# Patient Record
Sex: Female | Born: 1950 | Race: White | Hispanic: No | Marital: Married
Health system: Southern US, Community
[De-identification: ages and names within clinical notes are randomized; demographics above are authoritative.]

---

## 2000-12-10 ENCOUNTER — Ambulatory Visit (HOSPITAL_COMMUNITY): Admission: AD | Admit: 2000-12-10 | Discharge: 2000-12-10 | Payer: Self-pay | Admitting: *Deleted

## 2003-04-19 ENCOUNTER — Ambulatory Visit (HOSPITAL_COMMUNITY): Admission: RE | Admit: 2003-04-19 | Discharge: 2003-04-19 | Payer: Self-pay | Admitting: Thoracic Surgery

## 2003-08-04 ENCOUNTER — Ambulatory Visit (HOSPITAL_COMMUNITY): Admission: RE | Admit: 2003-08-04 | Discharge: 2003-08-04 | Payer: Self-pay | Admitting: Thoracic Surgery

## 2005-01-30 IMAGING — CR DG CHEST 2V
2 series · 2 of 2 positions shown · non-contrast
Comparison: none

CLINICAL DATA: Renal failure.  Preoperative evaluation.  Diabetes.  Hypertension.  
 TWO VIEW CHEST RADIOGRAPH, 04/19/03
 No prior studies.

[view not recorded (1 of 2)]
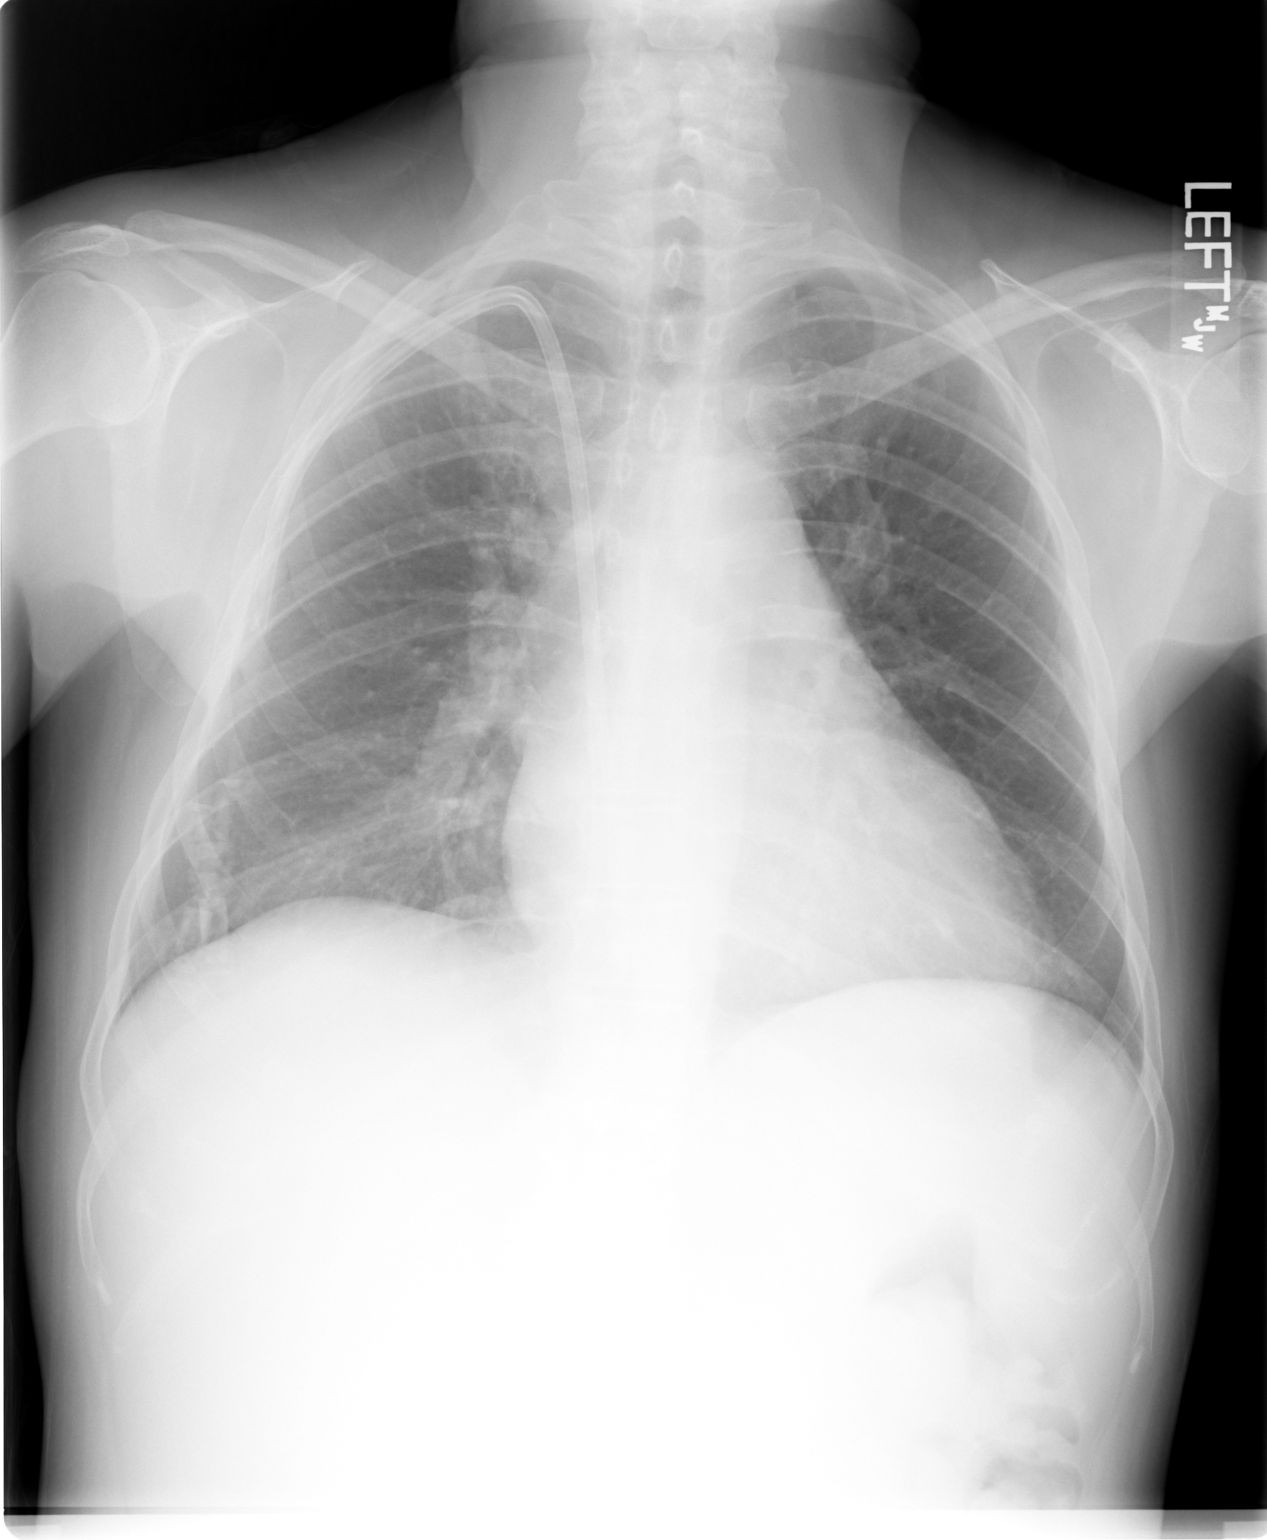

[view not recorded (2 of 2)]
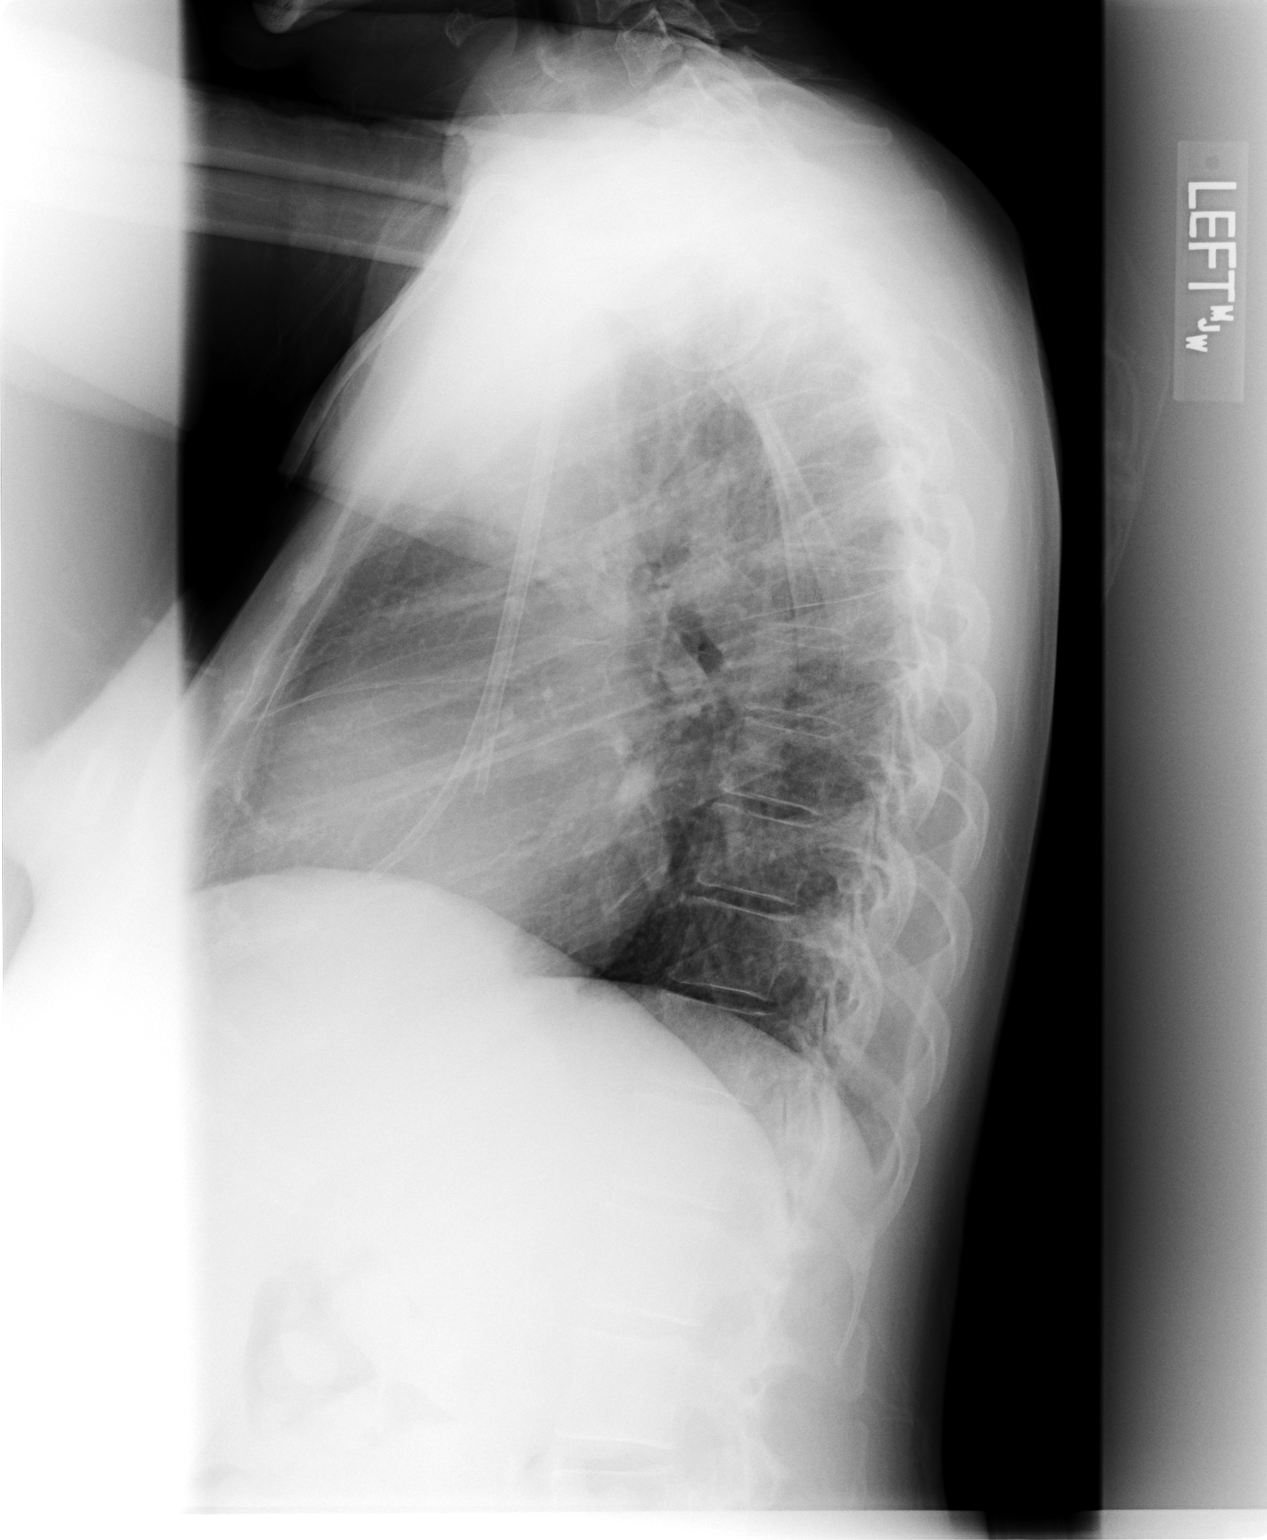

[2 of 2 positions shown; findings below may reference images not displayed]

FINDINGS: Right internal jugular central venous catheter is present with its distal tip in the right atrium.  Mild cardiomegaly is present.  The lungs appear clear.  
 IMPRESSION
 Mild cardiomegaly.

## 2005-08-08 ENCOUNTER — Ambulatory Visit (HOSPITAL_COMMUNITY): Admission: RE | Admit: 2005-08-08 | Discharge: 2005-08-08 | Payer: Self-pay | Admitting: Thoracic Surgery

## 2007-05-22 IMAGING — CR DG CHEST 2V
2 series · 2 of 2 positions shown · non-contrast
Comparison: 04/21/03.

CLINICAL DATA: Diabetes.  Hypertension.  Retained Diatek catheter.
 2-VIEW CHEST RADIOGRAPH - 08/08/05:

[view not recorded (1 of 2)]
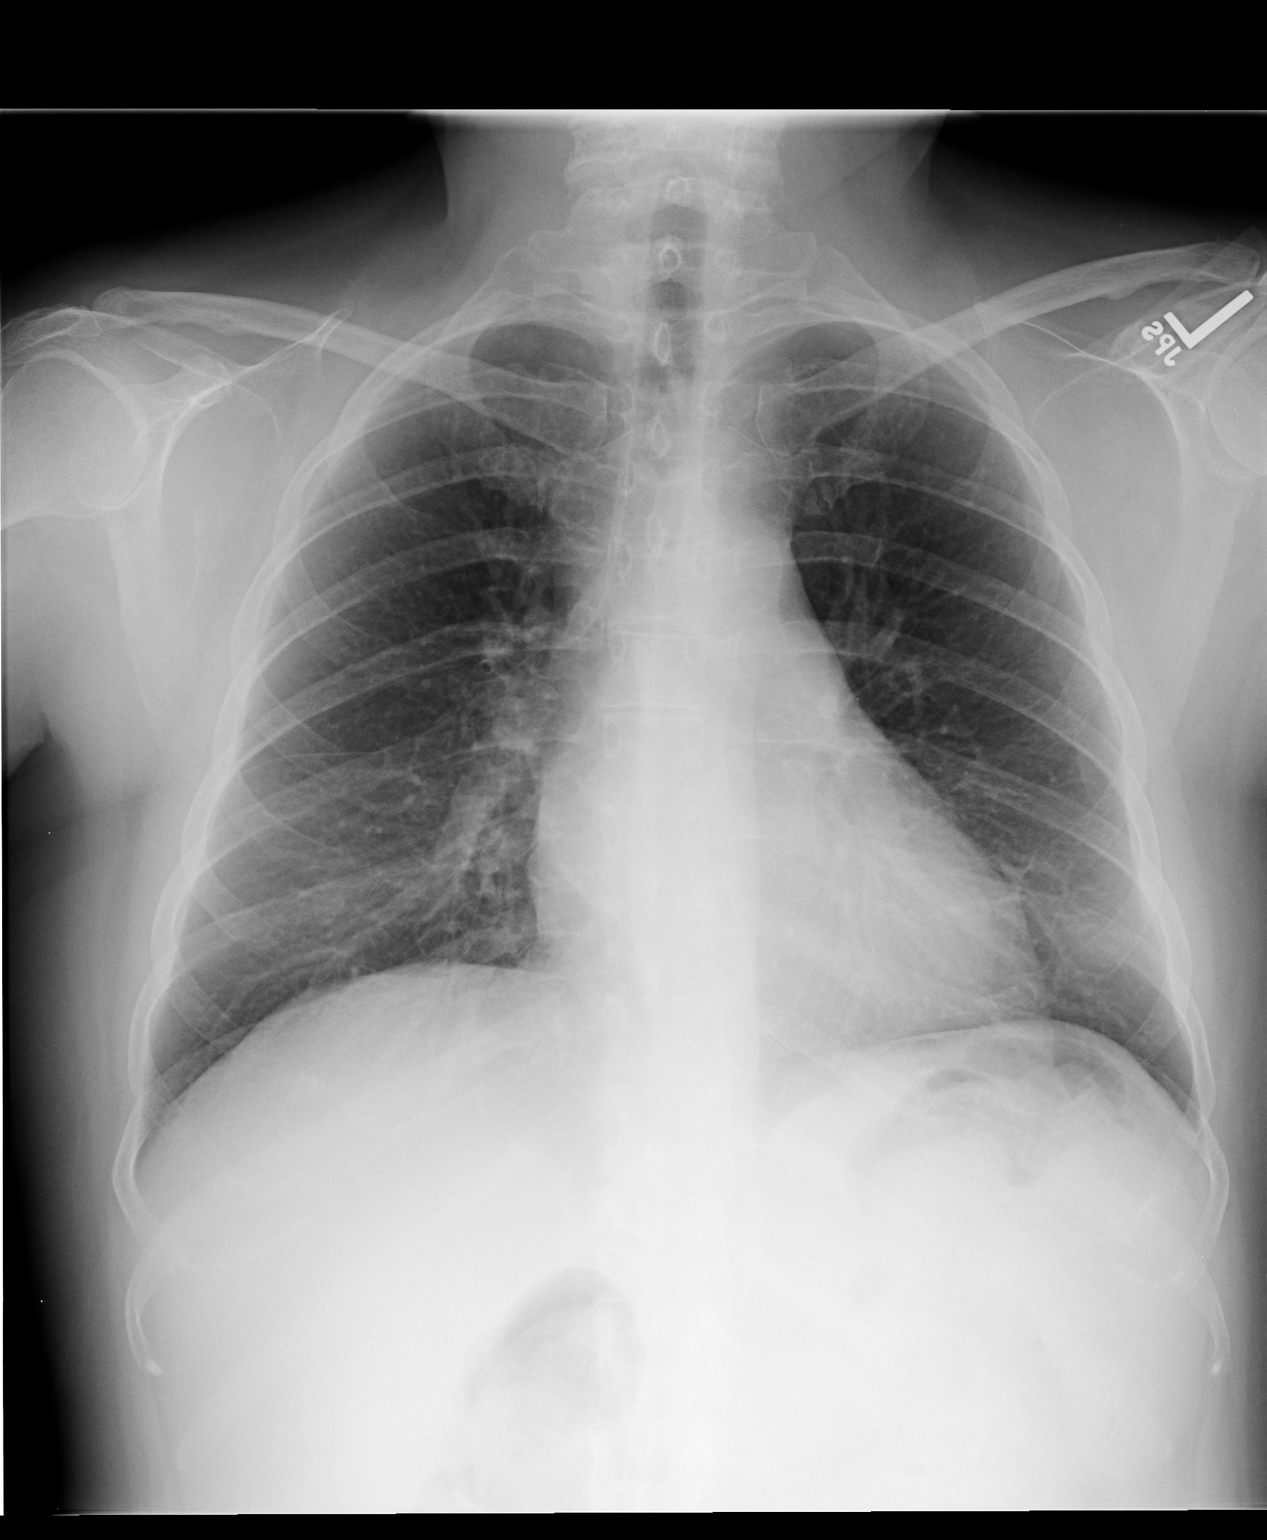

[view not recorded (2 of 2)]
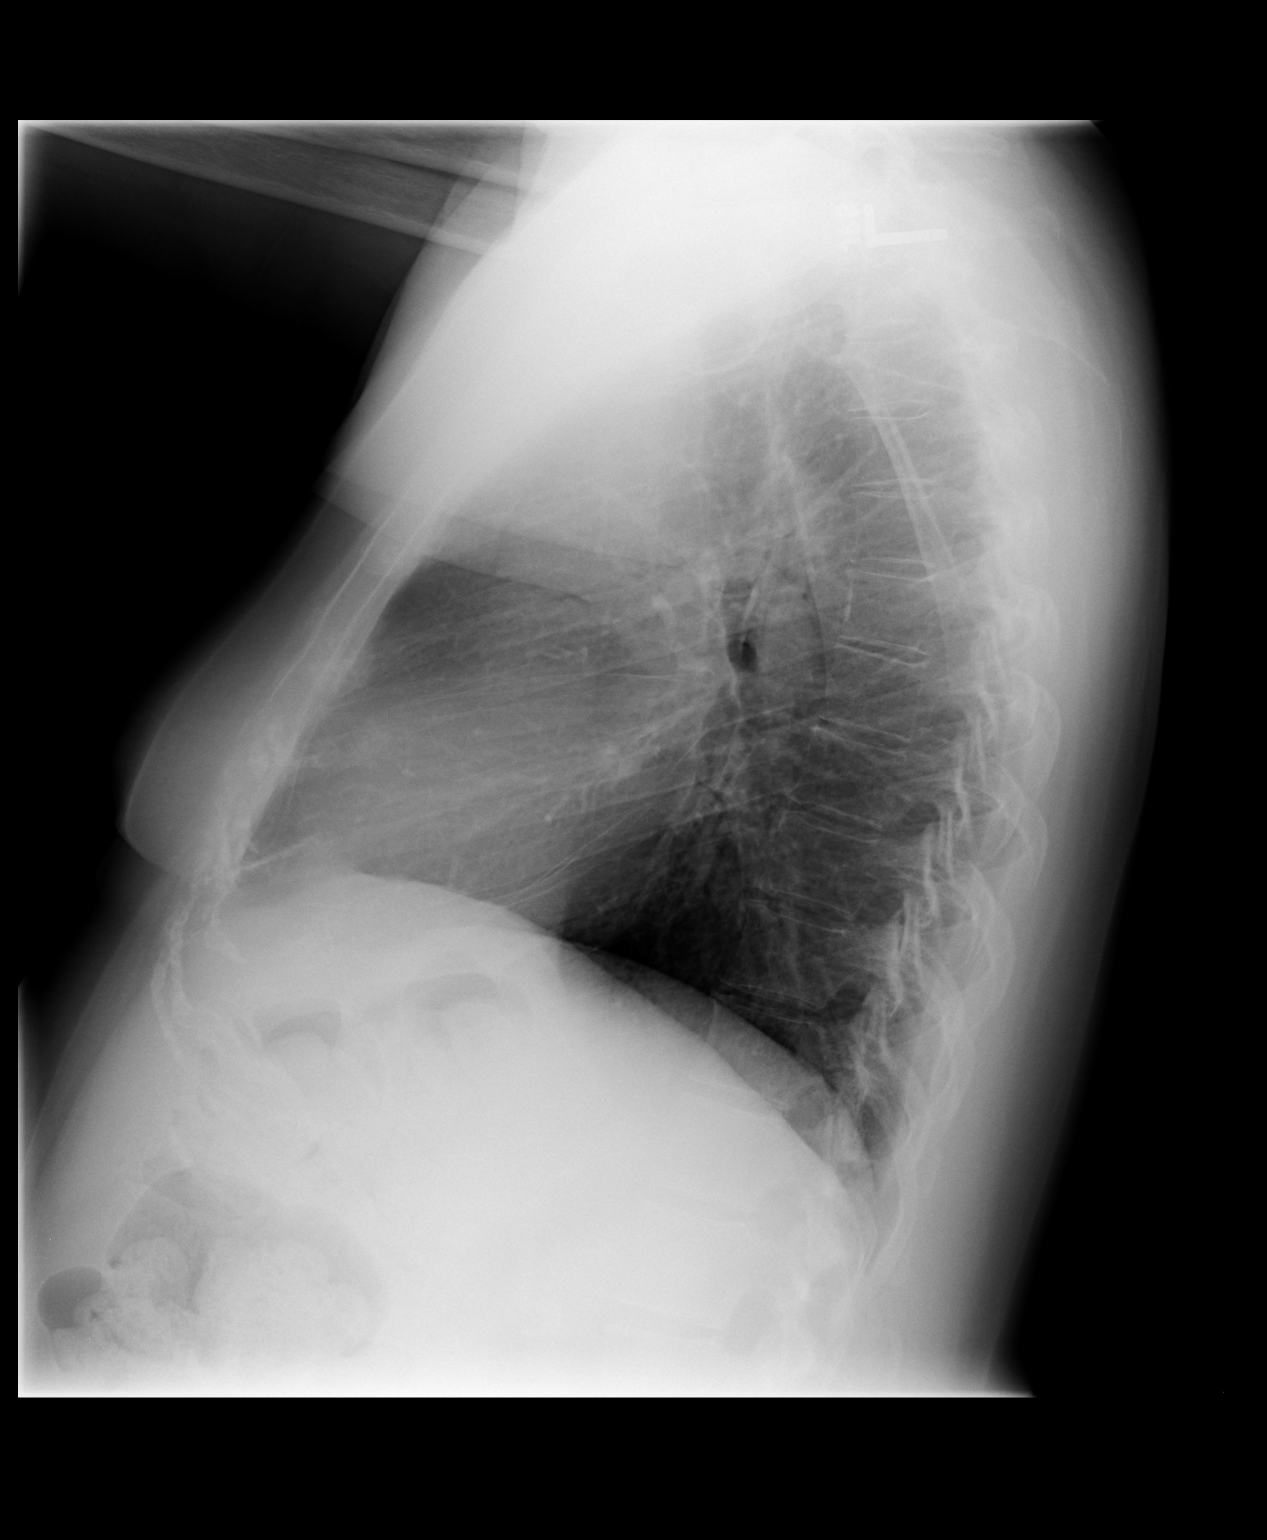

[2 of 2 positions shown; findings below may reference images not displayed]

FINDINGS: No definite retained catheter fragment is identified.  The lungs appear clear.  The heart and mediastinum appear unremarkable.
IMPRESSION: No active cardiopulmonary disease is radiographically apparent.

## 2010-08-28 DEATH — deceased

## 2022-02-17 ENCOUNTER — Telehealth: Payer: Self-pay | Admitting: Emergency Medicine

## 2022-02-17 NOTE — Telephone Encounter (Signed)
Encounter opened in error
